# Patient Record
Sex: Female | Born: 1975 | Hispanic: No | Marital: Single | Smoking: Never smoker
Health system: Southern US, Community
[De-identification: ages and names within clinical notes are randomized; demographics above are authoritative.]

## PROBLEM LIST (undated history)

## (undated) DIAGNOSIS — M5412 Radiculopathy, cervical region: Secondary | ICD-10-CM

## (undated) HISTORY — DX: Radiculopathy, cervical region: M54.12

---

## 2019-08-23 ENCOUNTER — Encounter (HOSPITAL_BASED_OUTPATIENT_CLINIC_OR_DEPARTMENT_OTHER): Payer: Self-pay | Admitting: Emergency Medicine

## 2019-08-23 ENCOUNTER — Emergency Department (HOSPITAL_BASED_OUTPATIENT_CLINIC_OR_DEPARTMENT_OTHER): Payer: BC Managed Care – PPO

## 2019-08-23 ENCOUNTER — Emergency Department (HOSPITAL_BASED_OUTPATIENT_CLINIC_OR_DEPARTMENT_OTHER)
Admission: EM | Admit: 2019-08-23 | Discharge: 2019-08-23 | Disposition: A | Discharge status: Home / Self Care | Payer: BC Managed Care – PPO | Attending: Emergency Medicine | Admitting: Emergency Medicine

## 2019-08-23 ENCOUNTER — Other Ambulatory Visit: Payer: Self-pay

## 2019-08-23 DIAGNOSIS — R202 Paresthesia of skin: Secondary | ICD-10-CM | POA: Diagnosis not present

## 2019-08-23 DIAGNOSIS — M4802 Spinal stenosis, cervical region: Secondary | ICD-10-CM | POA: Diagnosis not present

## 2019-08-23 DIAGNOSIS — M545 Low back pain: Secondary | ICD-10-CM | POA: Diagnosis present

## 2019-08-23 DIAGNOSIS — M25511 Pain in right shoulder: Secondary | ICD-10-CM | POA: Diagnosis not present

## 2019-08-23 DIAGNOSIS — M542 Cervicalgia: Secondary | ICD-10-CM | POA: Diagnosis not present

## 2019-08-23 MED ORDER — METHOCARBAMOL 500 MG PO TABS
750.0000 mg | ORAL_TABLET | Freq: Once | ORAL | Status: AC
  Administered 2019-08-23: 750 mg via ORAL
  Filled 2019-08-23: qty 2

## 2019-08-23 MED ORDER — METHOCARBAMOL 500 MG PO TABS: 500.0000 mg | ORAL_TABLET | Freq: Every evening | ORAL | 0 refills | Status: AC | PRN

## 2019-08-23 MED ORDER — KETOROLAC TROMETHAMINE 30 MG/ML IJ SOLN
30.0000 mg | Freq: Once | INTRAMUSCULAR | Status: AC
  Administered 2019-08-23: 30 mg via INTRAMUSCULAR
  Filled 2019-08-23: qty 1

## 2019-08-23 MED ORDER — PREDNISONE 20 MG PO TABS
40.0000 mg | ORAL_TABLET | Freq: Every day | ORAL | 0 refills | Status: AC
Start: 2019-08-23 — End: 2019-08-28

## 2019-08-23 MED ORDER — LIDOCAINE 5 % EX PTCH: 1.0000 | MEDICATED_PATCH | CUTANEOUS | 0 refills | Status: AC

## 2019-08-23 NOTE — ED Provider Notes (Signed)
[facility] EMERGENCY DEPARTMENT Provider Note   CSN: [id number] Arrival date & time: 08/23/19  1408     History Chief Complaint  Patient presents with  . Back Pain    Wanda Lewis is a 44 y.o. female presenting for evaluation of neck and right upper back pain.  Patient states that the past 2 Lamora 3 days, she has had gradual onset of pain which is now severe and constant.  She denies fall, trauma, or injury.  Pain is mostly in the right side of her neck and where her neck meets her shoulder.  She has been taking ibuprofen without improvement.  She reports associated tingling in the right arm, but no weakness or numbness.  Pain is significantly worse with movement of the neck, specifically looking up and down.  She denies symptoms on the left side.  She denies fevers, chills, rash, chest pain, shortness of breath, cough.  She denies history of similar.  She denies physical activity or heavy lifting.  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  She has no other medical problems, takes no medications daily.  HPI     History reviewed. No pertinent past medical history.  There are no problems Arismendez display for this patient.   History reviewed. No pertinent surgical history.   OB History   No obstetric history on file.     No family history on file.  Social History   Tobacco Use  . Smoking status: Never Smoker  . Smokeless tobacco: Never Used  Substance Use Topics  . Alcohol use: Not on file  . Drug use: Not on file    Home Medications Prior [place] Admission medications   Medication Sig Start Date End Date Taking? Authorizing Provider  lidocaine (LIDODERM) 5 % Place 1 patch onto the skin daily. Remove & Discard patch within 12 hours or as directed by MD 08/23/19   Teigan Sahli, PA-C  methocarbamol (ROBAXIN) 500 MG tablet Take 1 tablet (500 mg total) by mouth at bedtime as needed for muscle spasms. 08/23/19    Tarika Mckethan, PA-C  predniSONE (DELTASONE) 20 MG tablet Take 2 tablets (40 mg total) by mouth daily for 5 days. 08/23/19 08/28/19  Aleynah Rocchio, PA-C    Allergies    Patient has no known allergies.  Review of Systems   Review of Systems  Musculoskeletal: Positive for back pain and neck pain.  Neurological: Negative for numbness.    Physical Exam Updated Vital Signs BP 93/61   Pulse 64   Temp 97.7 F (36.5 C) (Oral)   Resp 16   Ht 4\' 9"  (1.448 m)   Wt 44.5 kg   SpO2 98%   BMI 21.21 kg/m   Physical Exam Vitals and nursing note reviewed.  Constitutional:      General: She is not in acute distress.    Appearance: She is well-developed.     Comments: Appears uncomfortable due Pucillo pain, otherwise nontoxic  HENT:     Head: Normocephalic and atraumatic.  Eyes:     Extraocular Movements: Extraocular movements intact.     Conjunctiva/sclera: Conjunctivae normal.     Pupils: Pupils are equal, round, and reactive Phommachanh light.  Neck:      Comments: Tenderness palpation of low midline C-spine.  Tenderness palpation of right paracervical muscles.  Patient reporting pain of the trapezius.  Increased pain with flexion and extension of the neck, no pain when  looking left or right. Cardiovascular:     Rate and Rhythm: Normal rate and regular rhythm.     Pulses: Normal pulses.  Pulmonary:     Effort: Pulmonary effort is normal. No respiratory distress.     Breath sounds: Normal breath sounds. No wheezing.  Abdominal:     General: There is no distension.     Palpations: Abdomen is soft. There is no mass.     Tenderness: There is no abdominal tenderness. There is no guarding or rebound.  Musculoskeletal:        General: Normal range of motion.     Cervical back: Normal range of motion and neck supple.     Comments: Full active range of motion of the right shoulder without pain.  5 out of 5 strength of bilateral shoulders and upper arm.  Radial pulses 2+ bilaterally.  Grip strength  equal bilaterally.  Good sensation of the right upper extremity. No tenderness palpation of thoracic or low back/midline spine.  Skin:    General: Skin is warm and dry.     Capillary Refill: Capillary refill takes less than 2 seconds.  Neurological:     Mental Status: She is alert and oriented Yarbro person, place, and time.     ED Results / Procedures / Treatments   Labs (all labs ordered are listed, but only abnormal results are displayed) Labs Reviewed - No data Conroy display  EKG None  Radiology CT Cervical Spine Wo Contrast  Result Date: 08/23/2019 CLINICAL DATA:  Right neck pain with radiculopathy. No known injury. EXAM: CT CERVICAL SPINE WITHOUT CONTRAST TECHNIQUE: Multidetector CT imaging of the cervical spine was performed without intravenous contrast. Multiplanar CT image reconstructions were also generated. COMPARISON:  None. FINDINGS: Alignment: Reversal of the normal cervical lordosis. No subluxations. Skull base and vertebrae: No acute fracture. No primary bone lesion or focal pathologic process. Soft tissues and spinal canal: No prevertebral fluid or swelling. No visible canal hematoma. Disc levels: C2-3: Unremarkable. There is posterior longitudinal ligament ossification behind the superior aspect of C3 vertebral body without canal stenosis. C3-4: Uncinate spur formation on the left producing moderate foraminal stenosis. No significant canal or right foraminal stenosis. C4-5: Small posterior disc protrusion centrally with mild Innis moderate posterior spur formation centrally. These changes are producing mild canal stenosis without foraminal stenosis or neural compression. C5-6: Small posterior disc protrusion centrally with mild associated spur formation Huge the left of midline causing mild canal stenosis. There is also moderate facet hypertrophy and spur formation on the left at that level, causing moderate foraminal stenosis. C6-7: Small Cifuentes moderate-sized disc protrusion centrally with  associated spur formation and posterior longitudinal ligament ossification, causing mild-Heiser-moderate canal stenosis without significant foraminal stenosis. C7-T1: No significant abnormality. Also noted is mild anterior spur formation at C4-5, C5-6 and C6-7 levels. There is also moderate facet hypertrophy and spur formation on the left at C5-6 level. Upper chest: Clear lung apices. Other: 5 mm densely calcified nodule in the posterior aspect of the left lobe of the thyroid gland. Not clinically significant; no follow-up imaging recommended (ref: J Am Coll Radiol. 2015 Feb;12(2): 143-50). IMPRESSION: 1. Multilevel degenerative changes, as described above. These include moderate left foraminal stenosis at the C3-4 level and moderate left foraminal stenosis at the C5-6 level. There is also mild-Pelto-moderate canal stenosis at the C6-7 level. 2. Reversal of the normal cervical lordosis. Electronically Signed   By: Beckie Salts M.D.   On: 08/23/2019 16:53    Procedures Procedures (  including critical care time)  Medications Ordered in ED Medications  ketorolac (TORADOL) 30 MG/ML injection 30 mg (30 mg Intramuscular Given 08/23/19 1551)  methocarbamol (ROBAXIN) tablet 750 mg (750 mg Oral Given 08/23/19 1550)    ED Course  I have reviewed the triage vital signs and the nursing notes.  Pertinent labs & imaging results that were available during my care of the patient were reviewed by me and considered in my medical decision making (see chart for details).    MDM Rules/Calculators/A&P                          Pt presenting for evaluation of neck and right shoulder pain.  On exam, patient peers nontoxic.  She is neurovascular intact.  However, she does appear very uncomfortable.  She has tenderness palpation over her midline spine and right trapezius.  Likely muscle strain/spasm.  Also consider radiculopathy.  However due Farrington her midline tenderness, consider occult/pathologic fracture or mass.  Will obtain CT  C-spine, treat with Toradol and Robaxin and reassess.  CT negative for acute findings, but does show multilevel stenosis of the cervical spine.  This is likely the cause of patient's pain.  On reassessment, patient reports improvement of pain, but not complete resolution.  She feels a little bit tired, likely due Leas the Robaxin.  Discussed importance of pain control and symptomatic treatment over the neck several days.  Encourage follow-up with sports medicine if symptoms not improving.  At this time, patient appears safe for discharge.  Return precautions given.  Patient states she understands and agrees Taddeo plan.  Final Clinical Impression(s) / ED Diagnoses Final diagnoses:  Cervical stenosis of spine    Rx / DC Orders ED Discharge Orders         Ordered    predniSONE (DELTASONE) 20 MG tablet  Daily     Discontinue  Reprint     08/23/19 1715    methocarbamol (ROBAXIN) 500 MG tablet  At bedtime PRN     Discontinue  Reprint     08/23/19 1715    lidocaine (LIDODERM) 5 %  Every 24 hours     Discontinue  Reprint     08/23/19 [place], Darah Simkin, PA-C 08/23/19 Corning, Bonfield, DO 08/24/19 [phone]

## 2019-08-23 NOTE — Discharge Instructions (Signed)
Take prednisone as prescribed.  Do not take other and anti-inflammatories at the same time (ibuprofen, Advil, Motrin, ibuprofen, Aleve), as this may cause stomach upset. You may supplement with Tylenol if you need further pain control. Use Robaxin as needed for muscle stiffness or soreness. Have caution, as this may make you tired or groggy. Do not drive or operate heavy machinery while taking this medication.  Use Lidoderm Zuleta help with pain. Use muscle creams (bengay, icy hot, salonpas) as needed for pain.  Do gentle stretches. Follow up with the sports medicine doctor listed below as needed for further evaluation.  Return Wanda Lewis the ER if you develop high fevers, numbness, difficulty breathing, shortness of breath, chest pain, or any new or concerning symptoms.

## 2019-08-23 NOTE — ED Notes (Signed)
States 2 days ago, rt arm began aching, now presents with rt shoulder pain, has full ROM, strong rt radial pulse, when moving RUE has a lot of moaning and facial grimacing is noted, ED PA at bedside

## 2019-08-23 NOTE — ED Notes (Signed)
Ice pack and repositioning did not relieve pain at all, medication orders rec

## 2019-08-23 NOTE — ED Notes (Signed)
Ice pack and repostioning done Wichmann aid in comfort

## 2019-08-23 NOTE — ED Triage Notes (Signed)
R thoracic back pain x 2 days. Denies injury.

## 2019-08-26 ENCOUNTER — Other Ambulatory Visit: Payer: Self-pay

## 2019-08-26 ENCOUNTER — Ambulatory Visit (INDEPENDENT_AMBULATORY_CARE_PROVIDER_SITE_OTHER): Payer: BC Managed Care – PPO | Admitting: Family Medicine

## 2019-08-26 ENCOUNTER — Encounter: Payer: Self-pay | Admitting: Family Medicine

## 2019-08-26 DIAGNOSIS — M5412 Radiculopathy, cervical region: Secondary | ICD-10-CM | POA: Insufficient documentation

## 2019-08-26 DIAGNOSIS — M542 Cervicalgia: Secondary | ICD-10-CM | POA: Diagnosis not present

## 2019-08-26 MED ORDER — KETOROLAC TROMETHAMINE 30 MG/ML IJ SOLN
30.0000 mg | Freq: Once | INTRAMUSCULAR | Status: AC
  Administered 2019-08-26: 30 mg via INTRAMUSCULAR

## 2019-08-26 NOTE — Assessment & Plan Note (Signed)
Neck pain seems more nerve or facet related as her pain is worse on extension.  No radicular symptoms at this time.  May have a component of spasm. -IM Toradol -Provided Duexis samples. -Counseled on home exercise therapy and supportive care. -Counseled on c-collar. -May need Mccune consider physical therapy or gabapentin.

## 2019-08-26 NOTE — Patient Instructions (Signed)
[name] meet you Please try heat  Please try the C-collar  Please start the duexis before bed. Please do not take with any other ibuprofen. Please take the duexis for 4 days straight.  Please stop the prednisone. Please continue the muscle relaxer.  Please start the exercises when the pain has improved   Please send me a message in MyChart with any questions or updates.  Please see me back in 2 weeks.   --Dr. Jordan Likes

## 2019-08-26 NOTE — Progress Notes (Signed)
  Wanda Lewis - 44 y.o. female MRN [id number]  Date of birth: 12/08/1975  SUBJECTIVE:  Including CC & ROS.  Chief Complaint  Patient presents with  . Neck Pain    right side    Wanda Lewis is a 44 y.o. female that is presenting with acute right-sided neck pain.  Has been ongoing for a couple of days.  Has not experienced any pain like this previously.  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  The pain is severe and constant in nature.  No radicular symptoms.  No history of surgery..  Independent review of CT cervical spine from 6/27 shows foraminal stenosis at C3-4 5-6 and 6/7.   Review of Systems See HPI   HISTORY: Past Medical, Surgical, Social, and Family History Reviewed & Updated per EMR.   Pertinent Historical Findings include:  No past medical history on file.  No past surgical history on file.  No family history on file.  Social History   Socioeconomic History  . Marital status: Unknown    Spouse name: Not on file  . Number of children: Not on file  . Years of education: Not on file  . Highest education level: Not on file  Occupational History  . Not on file  Tobacco Use  . Smoking status: Never Smoker  . Smokeless tobacco: Never Used  Substance and Sexual Activity  . Alcohol use: Not on file  . Drug use: Not on file  . Sexual activity: Not on file  Other Topics Concern  . Not on file  Social History Narrative  . Not on file   Social Determinants of Health   Financial Resource Strain:   . Difficulty of Paying Living Expenses:   Food Insecurity:   . Worried About Programme researcher, broadcasting/film/video in the Last Year:   . Barista in the Last Year:   Transportation Needs:   . Freight forwarder (Medical):   Marland Kitchen Lack of Transportation (Non-Medical):   Physical Activity:   . Days of Exercise per Week:   . Minutes of Exercise per Session:   Stress:   . Feeling of Stress :   Social Connections:   . Frequency of  Communication with Friends and Family:   . Frequency of Social Gatherings with Friends and Family:   . Attends Religious Services:   . Active Member of Clubs or Organizations:   . Attends Banker Meetings:   Marland Kitchen Marital Status:   Intimate Partner Violence:   . Fear of Current or Ex-Partner:   . Emotionally Abused:   Marland Kitchen Physically Abused:   . Sexually Abused:      PHYSICAL EXAM:  VS: BP 109/71   Pulse 68   Ht 4\' 9"  (1.448 m)   Wt 98 lb (44.5 kg)   BMI 21.21 kg/m  Physical Exam Gen: NAD, alert, cooperative with exam, well-appearing MSK:  Neck: Severe exacerbation of pain with extension. Normal flexion and lateral rotation. Positive Spurling's test. Neurovascularly intact     ASSESSMENT & PLAN:   Neck pain Neck pain seems more nerve or facet related as her pain is worse on extension.  No radicular symptoms at this time.  May have a component of spasm. -IM Toradol -Provided Duexis samples. -Counseled on home exercise therapy and supportive care. -Counseled on c-collar. -May need Oyola consider physical therapy or gabapentin.

## 2019-08-26 NOTE — Progress Notes (Signed)
Medication Samples have been provided Perleberg the patient.  Drug name: Duexis       Strength: 800mg /26.6mg          Qty: 2 boxes  LOT:  Exp.Date: 04/2020  Dosing instructions: take 1 tablet by mouth three (3) times a day.  The patient has been instructed regarding the correct time, dose, and frequency of taking this medication, including desired effects and most common side effects.   05/2020, Kathi Simpers 11:02 AM 08/26/2019

## 2019-09-04 ENCOUNTER — Other Ambulatory Visit: Payer: Self-pay

## 2019-09-04 ENCOUNTER — Encounter: Payer: Self-pay | Admitting: Family Medicine

## 2019-09-04 ENCOUNTER — Ambulatory Visit (INDEPENDENT_AMBULATORY_CARE_PROVIDER_SITE_OTHER): Payer: BC Managed Care – PPO | Admitting: Family Medicine

## 2019-09-04 VITALS — BP 106/71 | HR 66 | Ht <= 58 in | Wt 98.0 lb

## 2019-09-04 DIAGNOSIS — M5412 Radiculopathy, cervical region: Secondary | ICD-10-CM

## 2019-09-04 MED ORDER — GABAPENTIN 100 MG PO CAPS: 100.0000 mg | ORAL_CAPSULE | Freq: Three times a day (TID) | ORAL | 1 refills | Status: AC

## 2019-09-04 NOTE — Progress Notes (Signed)
Medication Samples have been provided Garant the patient.  Drug name: Rayos       Strength: 5mg         Qty: 2 boxes  LOT: E  Exp.Date: 12/2019  Dosing instructions: take 3 tablets by mouth as close Brenton 10 pm as possible.  The patient has been instructed regarding the correct time, dose, and frequency of taking this medication, including desired effects and most common side effects.   01/2020, Kathi Simpers 12:25 PM 09/04/2019

## 2019-09-04 NOTE — Assessment & Plan Note (Signed)
Symptoms seem more radicular in nature.  CT was showing more left-sided type pathology.  Her symptoms are all right-sided. -Counseled on home exercise therapy and supportive care. -Gabapentin. -Provided Rayos samples. -Epidural injection. -May consider MRI or physical therapy.

## 2019-09-04 NOTE — Progress Notes (Signed)
  Wanda Lewis - 44 y.o. female MRN [id number]  Date of birth: May 02, 1975  SUBJECTIVE:  Including CC & ROS.  Chief Complaint  Patient presents with  . Follow-up    right arm pain    Wanda Lewis is a 44 y.o. female that is presenting with worsening of her neck pain but also having radicular type symptoms on the right arm.  She received no improvement with the Toradol injection.  Symptoms are ongoing and severe.  Denies any numbness or tingling.   Review of Systems See HPI   HISTORY: Past Medical, Surgical, Social, and Family History Reviewed & Updated per EMR.   Pertinent Historical Findings include:  No past medical history on file.  No past surgical history on file.  No family history on file.  Social History   Socioeconomic History  . Marital status: Unknown    Spouse name: Not on file  . Number of children: Not on file  . Years of education: Not on file  . Highest education level: Not on file  Occupational History  . Not on file  Tobacco Use  . Smoking status: Never Smoker  . Smokeless tobacco: Never Used  Substance and Sexual Activity  . Alcohol use: Not on file  . Drug use: Not on file  . Sexual activity: Not on file  Other Topics Concern  . Not on file  Social History Narrative  . Not on file   Social Determinants of Health   Financial Resource Strain:   . Difficulty of Paying Living Expenses:   Food Insecurity:   . Worried About Programme researcher, broadcasting/film/video in the Last Year:   . Barista in the Last Year:   Transportation Needs:   . Freight forwarder (Medical):   Marland Kitchen Lack of Transportation (Non-Medical):   Physical Activity:   . Days of Exercise per Week:   . Minutes of Exercise per Session:   Stress:   . Feeling of Stress :   Social Connections:   . Frequency of Communication with Friends and Family:   . Frequency of Social Gatherings with Friends and Family:   . Attends Religious Services:   . Active Member of Clubs or Organizations:   . Attends  Banker Meetings:   Marland Kitchen Marital Status:   Intimate Partner Violence:   . Fear of Current or Ex-Partner:   . Emotionally Abused:   Marland Kitchen Physically Abused:   . Sexually Abused:      PHYSICAL EXAM:  VS: BP 106/71   Pulse 66   Ht 4\' 9"  (1.448 m)   Wt 98 lb (44.5 kg)   BMI 21.21 kg/m  Physical Exam Gen: NAD, alert, cooperative with exam, well-appearing MSK:  Neck: Trapezius trigger points appreciated on palpation. Limited extension. Normal shoulder range of motion. Normal strength resistance. Normal grip strength. Neurovascular intact     ASSESSMENT & PLAN:   Cervical radiculopathy Symptoms seem more radicular in nature.  CT was showing more left-sided type pathology.  Her symptoms are all right-sided. -Counseled on home exercise therapy and supportive care. -Gabapentin. -Provided Rayos samples. -Epidural injection. -May consider MRI or physical therapy.

## 2019-09-04 NOTE — Patient Instructions (Signed)
Good Piet see you Please take the rayos as close Dovidio 10 pm as you can.  Please try the gabapentin. Please start with 1 pill at night. Do this for 3 or 4 night. If no improvement increase Tsuda 2 and 3 pills as you tolerate.  They should be calling [facility]ina set up an epidrual injection  Please let me know if you are still in pain after this.   Please send me a message in MyChart with any questions or updates.  Please see me back in 4 weeks.   --Dr. Jordan Likes

## 2019-09-08 ENCOUNTER — Inpatient Hospital Stay: Admission: RE | Admit: 2019-09-08 | Payer: BC Managed Care – PPO | Source: Ambulatory Visit

## 2019-09-08 ENCOUNTER — Other Ambulatory Visit: Payer: Self-pay

## 2019-09-08 ENCOUNTER — Ambulatory Visit
Admission: RE | Admit: 2019-09-08 | Discharge: 2019-09-08 | Disposition: A | Discharge status: Home / Self Care | Payer: BC Managed Care – PPO | Source: Ambulatory Visit | Attending: Family Medicine | Admitting: Family Medicine

## 2019-09-08 DIAGNOSIS — M5412 Radiculopathy, cervical region: Secondary | ICD-10-CM

## 2019-09-08 MED ORDER — IOPAMIDOL (ISOVUE-M 300) INJECTION 61%
1.0000 mL | Freq: Once | INTRAMUSCULAR | Status: AC | PRN
  Administered 2019-09-08: 1 mL via EPIDURAL

## 2019-09-08 MED ORDER — TRIAMCINOLONE ACETONIDE 40 MG/ML IJ SUSP (RADIOLOGY)
60.0000 mg | Freq: Once | INTRAMUSCULAR | Status: AC
  Administered 2019-09-08: 60 mg via EPIDURAL

## 2019-09-08 NOTE — Discharge Instructions (Signed)
Post Procedure Spinal Discharge Instruction Sheet  1. You may resume a regular diet and any medications that you routinely take (including pain medications).  2. No driving day of procedure.  3. Light activity throughout the rest of the day.  Do not do any strenuous work, exercise, bending or lifting.  The day following the procedure, you can resume normal physical activity but you should refrain from exercising or physical therapy for at least three days thereafter.   Common Side Effects:   Headaches- take your usual medications as directed by your physician.  Increase your fluid intake.  Caffeinated beverages may be helpful.  Lie flat in bed until your headache resolves.   Restlessness or inability Murrell sleep- you may have trouble sleeping for the next few days.  Ask your referring physician if you need any medication for sleep.   Facial flushing or redness- should subside within a few days.   Increased pain- a temporary increase in pain a day or two following your procedure is not unusual.  Take your pain medication as prescribed by your referring physician.   Leg cramps  Please contact our office at [phone] for the following symptoms:  Fever greater than 100 degrees.  Headaches unresolved with medication after 2-3 days.  Increased swelling, pain, or redness at injection site. 

## 2019-09-09 ENCOUNTER — Ambulatory Visit: Payer: BC Managed Care – PPO | Admitting: Family Medicine

## 2019-09-09 NOTE — Progress Notes (Deleted)
°  Wanda Lewis - 44 y.o. female MRN [id number]  Date of birth: 1975-03-25  SUBJECTIVE:  Including CC & ROS.  No chief complaint on file.   Wanda Lewis is a 44 y.o. female that is  ***.  ***   Review of Systems See HPI   HISTORY: Past Medical, Surgical, Social, and Family History Reviewed & Updated per EMR.   Pertinent Historical Findings include:  No past medical history on file.  No past surgical history on file.  No family history on file.  Social History   Socioeconomic History   Marital status: Unknown    Spouse name: Not on file   Number of children: Not on file   Years of education: Not on file   Highest education level: Not on file  Occupational History   Not on file  Tobacco Use   Smoking status: Never Smoker   Smokeless tobacco: Never Used  Substance and Sexual Activity   Alcohol use: Not on file   Drug use: Not on file   Sexual activity: Not on file  Other Topics Concern   Not on file  Social History Narrative   Not on file   Social Determinants of Health   Financial Resource Strain:    Difficulty of Paying Living Expenses:   Food Insecurity:    Worried About Running Out of Food in the Last Year:    Barista in the Last Year:   Transportation Needs:    Freight forwarder (Medical):    Lack of Transportation (Non-Medical):   Physical Activity:    Days of Exercise per Week:    Minutes of Exercise per Session:   Stress:    Feeling of Stress :   Social Connections:    Frequency of Communication with Friends and Family:    Frequency of Social Gatherings with Friends and Family:    Attends Religious Services:    Active Member of Clubs or Organizations:    Attends Engineer, structural:    Marital Status:   Intimate Partner Violence:    Fear of Current or Ex-Partner:    Emotionally Abused:    Physically Abused:    Sexually Abused:      PHYSICAL EXAM:  VS: There were no vitals taken for this  visit. Physical Exam Gen: NAD, alert, cooperative with exam, well-appearing MSK:  ***      ASSESSMENT & PLAN:   No problem-specific Assessment & Plan notes found for this encounter.

## 2019-09-21 ENCOUNTER — Other Ambulatory Visit: Payer: Self-pay

## 2019-09-21 ENCOUNTER — Ambulatory Visit (INDEPENDENT_AMBULATORY_CARE_PROVIDER_SITE_OTHER): Payer: BC Managed Care – PPO | Admitting: Family Medicine

## 2019-09-21 ENCOUNTER — Encounter: Payer: Self-pay | Admitting: Family Medicine

## 2019-09-21 ENCOUNTER — Other Ambulatory Visit: Payer: BC Managed Care – PPO

## 2019-09-21 DIAGNOSIS — M5412 Radiculopathy, cervical region: Secondary | ICD-10-CM | POA: Diagnosis not present

## 2019-09-21 MED ORDER — METHYLPREDNISOLONE ACETATE 40 MG/ML IJ SUSP
40.0000 mg | Freq: Once | INTRAMUSCULAR | Status: AC
  Administered 2019-09-21: 40 mg via INTRA_ARTICULAR

## 2019-09-21 NOTE — Patient Instructions (Signed)
Good Wickey see you  Please try heat  Please try a massage. [facility]. [address], [place], [place] [address]. [phone].  You can discontinue physical therapy if you feel like it isn't helping  Please send me a message in MyChart with any questions or updates.  Please see me back in 4 weeks.   --Dr. Jordan Likes

## 2019-09-21 NOTE — Assessment & Plan Note (Signed)
Has had some improvement of her radicular pain with the epidural.  No suggestion of it coming from the shoulder based on clinical exam.  Multiple trigger points appreciated that are is periscapular in nature.  Still seems radicular and less likely for elbow or shoulder pathology.  Less likely for polymyalgia. -Counseled on home exercise therapy and supportive care. -Trigger point injections. -Can discontinue physical therapy if she feels it is not helping. -If no improvement will consider nerve study versus lab work.

## 2019-09-21 NOTE — Progress Notes (Signed)
Wanda Lewis - 44 y.o. female MRN [id number]  Date of birth: 1975-10-09  SUBJECTIVE:  Including CC & ROS.  Chief Complaint  Patient presents with  . Follow-up    neck    Wanda Lewis is a 44 y.o. female that is presenting with worsening right sided periscapular pain and radicular pain.  Has received an epidural injection.  Still feels pain in the posterior aspect of the arm.   Review of Systems See HPI   HISTORY: Past Medical, Surgical, Social, and Family History Reviewed & Updated per EMR.   Pertinent Historical Findings include:  No past medical history on file.  No past surgical history on file.  No family history on file.  Social History   Socioeconomic History  . Marital status: Unknown    Spouse name: Not on file  . Number of children: Not on file  . Years of education: Not on file  . Highest education level: Not on file  Occupational History  . Not on file  Tobacco Use  . Smoking status: Never Smoker  . Smokeless tobacco: Never Used  Substance and Sexual Activity  . Alcohol use: Not on file  . Drug use: Not on file  . Sexual activity: Not on file  Other Topics Concern  . Not on file  Social History Narrative  . Not on file   Social Determinants of Health   Financial Resource Strain:   . Difficulty of Paying Living Expenses:   Food Insecurity:   . Worried About Programme researcher, broadcasting/film/video in the Last Year:   . Barista in the Last Year:   Transportation Needs:   . Freight forwarder (Medical):   Marland Kitchen Lack of Transportation (Non-Medical):   Physical Activity:   . Days of Exercise per Week:   . Minutes of Exercise per Session:   Stress:   . Feeling of Stress :   Social Connections:   . Frequency of Communication with Friends and Family:   . Frequency of Social Gatherings with Friends and Family:   . Attends Religious Services:   . Active Member of Clubs or Organizations:   . Attends Banker Meetings:   Marland Kitchen Marital Status:   Intimate Partner  Violence:   . Fear of Current or Ex-Partner:   . Emotionally Abused:   Marland Kitchen Physically Abused:   . Sexually Abused:      PHYSICAL EXAM:  VS: BP 117/73   Pulse 69   Ht 4\' 9"  (1.448 m)   Wt 98 lb (44.5 kg)   BMI 21.21 kg/m  Physical Exam Gen: NAD, alert, cooperative with exam, well-appearing MSK:  Right shoulder: Normal range of motion. Normal internal and external rotation. Normal abduction. Negative empty can testing. Neurovascularly intact   Aspiration/Injection Procedure Note [name] [birth year]  Procedure: Injection Indications: Right trigger points  Procedure Details Consent: Risks of procedure as well as the alternatives and risks of each were explained [place] the (patient/caregiver).  Consent for procedure obtained. Time Out: Verified patient identification, verified procedure, site/side was marked, verified correct patient position, special equipment/implants available, medications/allergies/relevent history reviewed, required imaging and test results available.  Performed.  The area was cleaned with iodine and alcohol swabs.    The right trapezius and rhomboids was injected using 1 cc's of 40 mg Depo-Medrol and 4 cc's of 0.25% bupivacaine with a 25 1" needle.  Ultrasound was used.      A sterile dressing was applied.  Patient did tolerate procedure  well.     ASSESSMENT & PLAN:   Cervical radiculopathy Has had some improvement of her radicular pain with the epidural.  No suggestion of it coming from the shoulder based on clinical exam.  Multiple trigger points appreciated that are is periscapular in nature.  Still seems radicular and less likely for elbow or shoulder pathology.  Less likely for polymyalgia. -Counseled on home exercise therapy and supportive care. -Trigger point injections. -Can discontinue physical therapy if she feels it is not helping. -If no improvement will consider nerve study versus lab work.

## 2019-10-08 ENCOUNTER — Telehealth: Payer: Self-pay | Admitting: Family Medicine

## 2019-10-08 DIAGNOSIS — M5412 Radiculopathy, cervical region: Secondary | ICD-10-CM

## 2019-10-08 NOTE — Telephone Encounter (Signed)
Referral placed [facility] for EMG/nerve conduction  Myra Rude, MD [facility] 10/08/2019, 5:16 PM

## 2019-10-08 NOTE — Telephone Encounter (Signed)
Pt's brotherRoger Shelter Neenan) says she is still in a lot of pain & thinks the prescription not working anymore Hanf manage pain & says at her last OV provider said would consider referring her out [facility] specialist if needed.  -- Forwarding request [facility] Dr. Jordan Likes-- Pt wishes [place] be referred out [facility] specialist.  --glh

## 2019-10-12 ENCOUNTER — Telehealth: Payer: Self-pay | Admitting: Family Medicine

## 2019-10-12 NOTE — Telephone Encounter (Signed)
[facility] @ [phone] has access Epic & sees all patient information Re: Referral--- they will contact pt  for appt after referral/recse reviewed by neurologist.  --Fausto Skillern

## 2019-10-22 ENCOUNTER — Ambulatory Visit: Payer: BC Managed Care – PPO | Admitting: Family Medicine

## 2019-12-16 ENCOUNTER — Other Ambulatory Visit: Payer: Self-pay

## 2019-12-16 ENCOUNTER — Ambulatory Visit (INDEPENDENT_AMBULATORY_CARE_PROVIDER_SITE_OTHER): Payer: BC Managed Care – PPO | Admitting: Diagnostic Neuroimaging

## 2019-12-16 ENCOUNTER — Encounter: Payer: Self-pay | Admitting: Diagnostic Neuroimaging

## 2019-12-16 VITALS — BP 118/74 | HR 69 | Ht 59.0 in | Wt 103.0 lb

## 2019-12-16 DIAGNOSIS — M25511 Pain in right shoulder: Secondary | ICD-10-CM | POA: Diagnosis not present

## 2019-12-16 DIAGNOSIS — G8929 Other chronic pain: Secondary | ICD-10-CM | POA: Diagnosis not present

## 2019-12-16 NOTE — Patient Instructions (Signed)
RIGHT SHOULDER BLADE PAIN (radiating Kimberling right arm) --> ddx: rotator cuff tendonitis vs cervical radiculopathy - continue PT/OT exercises - neurologic exam unremarkable; therefore EMG/NCS not recommended at this time - follow up with sports medicine or orthopedic clinic

## 2019-12-16 NOTE — Progress Notes (Signed)
[facility]  PATIENT: Wanda Lewis DOB: August 30, 1975  REFERRING CLINICIAN: Myra Rude, MD HISTORY FROM: patient  REASON FOR VISIT: new consult    HISTORICAL  CHIEF COMPLAINT:  Chief Complaint  Patient presents with   Cervical radiculopathy    rm 7 New Pt, brother- Dong, interpreter- [place] "neck pain x 4 months, no history of injury; Gabapentin, Robaxin , injection didn't help"    HISTORY OF PRESENT ILLNESS:   44 year old female here for evaluation of right sided shoulder and arm pain.  Symptoms started in June 2021 with right scapular pain radiating Miggins the right arm and right hand digits 2-4. Some pain symptoms radiate into the right trapezius and neck region. No prodromal triggering factors. No accidents injuries or traumas. No change in work, sleep or stress. 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 Patient went [facility] for evaluation and had CT of the cervical spine which showed degenerative findings. She was referred [facility] for evaluation. She tried conservative management and then had cervical epidural steroid injection but this did not help. She also had trigger point injection of right trapezius and right rhomboids with ultrasound guidance. Unfortunately symptoms have not improved. Patient was referred [facility] clinic for consideration of possible nerve conduction study.   REVIEW OF SYSTEMS: Full 14 system review of systems performed and negative with exception of: As per HPI.  ALLERGIES: No Known Allergies  HOME MEDICATIONS: Outpatient Medications Prior [facility] Visit  Medication Sig Dispense Refill   gabapentin (NEURONTIN) 100 MG capsule Take 1 capsule (100 mg total) by mouth 3 (three) times daily. 90 capsule 1   lidocaine (LIDODERM) 5 % Place 1 patch onto the skin daily. Remove & Discard patch within 12 hours or as directed by MD 30 patch 0   methocarbamol (ROBAXIN) 500 MG tablet Take 1 tablet (500 mg total) by mouth at  bedtime as needed for muscle spasms. 20 tablet 0   No facility-administered medications prior [facility] visit.    PAST MEDICAL HISTORY: No past medical history on file.  PAST SURGICAL HISTORY: No past surgical history on file.  FAMILY HISTORY: No family history on file.  SOCIAL HISTORY: Social History   Socioeconomic History   Marital status: Unknown    Spouse name: Not on file   Number of children: Not on file   Years of education: Not on file   Highest education level: Not on file  Occupational History   Not on file  Tobacco Use   Smoking status: Never Smoker   Smokeless tobacco: Never Used  Substance and Sexual Activity   Alcohol use: Not on file   Drug use: Not on file   Sexual activity: Not on file  Other Topics Concern   Not on file  Social History Narrative   Not on file   Social Determinants of Health   Financial Resource Strain:    Difficulty of Paying Living Expenses: Not on file  Food Insecurity:    Worried About Running Out of Food in the Last Year: Not on file   Ran Out of Food in the Last Year: Not on file  Transportation Needs:    Lack of Transportation (Medical): Not on file   Lack of Transportation (Non-Medical): Not on file  Physical Activity:    Days of Exercise per Week: Not on file   Minutes of Exercise per Session: Not on file  Stress:    Feeling of Stress : Not on file  Social Connections:    Frequency of Communication with Friends and Family: Not on file   Frequency of Social Gatherings with Friends and Family: Not on file   Attends Religious Services: Not on Scientist, clinical (histocompatibility and immunogenetics) or Organizations: Not on file   Attends Banker Meetings: Not on file   Marital Status: Not on file  Intimate Partner Violence:    Fear of Current or Ex-Partner: Not on file   Emotionally Abused: Not on file   Physically Abused: Not on file   Sexually Abused: Not on file     PHYSICAL EXAM  GENERAL  EXAM/CONSTITUTIONAL:  Vitals: There were no vitals filed for this visit.  There is no height or weight on file Below calculate BMI. Wt Readings from Last 3 Encounters:  09/21/19 98 lb (44.5 kg)  09/04/19 98 lb (44.5 kg)  08/26/19 98 lb (44.5 kg)     Patient is in no distress; well developed, nourished and groomed  NECK DISCOMFORT WITH EXT, FLEX, ROTATION  MILD PAIN WITH RIGHT ARM INTERNALLY ROTATED AND BEHIND BACK  CARDIOVASCULAR:  Examination of carotid arteries is normal; no carotid bruits  Regular rate and rhythm, no murmurs  Examination of peripheral vascular system by observation and palpation is normal  EYES:  Ophthalmoscopic exam of optic discs and posterior segments is normal; no papilledema or hemorrhages  No exam data present  MUSCULOSKELETAL:  Gait, strength, tone, movements noted in Neurologic exam below  NEUROLOGIC: MENTAL STATUS:  No flowsheet data found.  awake, alert, oriented Kanner person, place and time  recent and remote memory intact  normal attention and concentration  language fluent, comprehension intact, naming intact  fund of knowledge appropriate  CRANIAL NERVE:   2nd - no papilledema on fundoscopic exam  2nd, 3rd, 4th, 6th - pupils equal and reactive Rippetoe light, visual fields full Fosdick confrontation, extraocular muscles intact, no nystagmus  5th - facial sensation symmetric  7th - facial strength symmetric  8th - hearing intact  9th - palate elevates symmetrically, uvula midline  11th - shoulder shrug symmetric  12th - tongue protrusion midline  MOTOR:   normal bulk and tone, full strength in the BUE, BLE  SENSORY:   normal and symmetric Burkard light touch, pinprick, temperature, vibration  COORDINATION:   finger-nose-finger, fine finger movements normal  REFLEXES:   deep tendon reflexes present and symmetric  GAIT/STATION:   narrow based gait     DIAGNOSTIC DATA (LABS, IMAGING, TESTING) - I reviewed patient  records, labs, notes, testing and imaging myself where available.  No results found for: WBC, HGB, HCT, MCV, PLT No results found for: NA, K, CL, CO2, GLUCOSE, BUN, CREATININE, CALCIUM, PROT, ALBUMIN, AST, ALT, ALKPHOS, BILITOT, GFRNONAA, GFRAA No results found for: CHOL, HDL, LDLCALC, LDLDIRECT, TRIG, CHOLHDL No results found for: DDUK0U No results found for: VITAMINB12 No results found for: TSH   08/23/19 CT cervical spine (without)  1. Multilevel degenerative changes, as described above. These include moderate left foraminal stenosis at the C3-4 level and moderate left foraminal stenosis at the C5-6 level. There is also mild-Wedeking-moderate canal stenosis at the C6-7 level. 2. Reversal of the normal cervical lordosis.    ASSESSMENT AND PLAN  44 y.o. year old female here with right scapular pain rating Fauver the right arm, with mild radiation into the right trapezius. Symptoms could be related Engelbert rotator cuff tendinitis versus cervical radiculopathy, although CT of cervical spine shows more problems on the left side than  right. Today patient denies any significant neck problems. Patient and brother feel like the problem is coming from her shoulder. Neurologic examination is unremarkable today and therefore EMG nerve conduction study not likely [place]oll better elucidate etiology. No clinical signs of carpal tunnel syndrome or ulnar neuropathy. Suspect musculoskeletal cause of symptoms, possibly related Cerniglia the right shoulder and scapula.  Dx:  1. Chronic right shoulder pain     PLAN:  RIGHT SHOULDER BLADE PAIN (radiating Hommel right arm) --> ddx: rotator cuff tendonitis vs cervical radiculopathy - continue PT/OT exercises - neurologic exam unremarkable; therefore EMG/NCS not recommended at this time - follow up with sports medicine or orthopedic clinic; may consider ultrasound or MRI right shoulder  Return return [facility].    Suanne Marker, MD 12/16/2019, 10:39  AM Certified in Neurology, Neurophysiology and Neuroimaging  [facility] [address], [address] [place], [place] [address] [phone]

## 2021-05-15 ENCOUNTER — Ambulatory Visit: Payer: BLUE CROSS/BLUE SHIELD | Admitting: Registered Nurse

## 2021-07-29 IMAGING — CT CT CERVICAL SPINE W/O CM
3 of 4 series · 10 of 33 positions shown, 12 images · non-contrast
Comparison: None.

CLINICAL DATA: Right neck pain with radiculopathy. No known injury.

EXAM:
CT CERVICAL SPINE WITHOUT CONTRAST
TECHNIQUE: Multidetector CT imaging of the cervical spine was performed without
intravenous contrast. Multiplanar CT image reconstructions were also
generated.

[Series 5: sagittal bone · sagittal · 0.22mm/px · 5 of 64 slices shown, 6 images]
[im 22/64  bone]
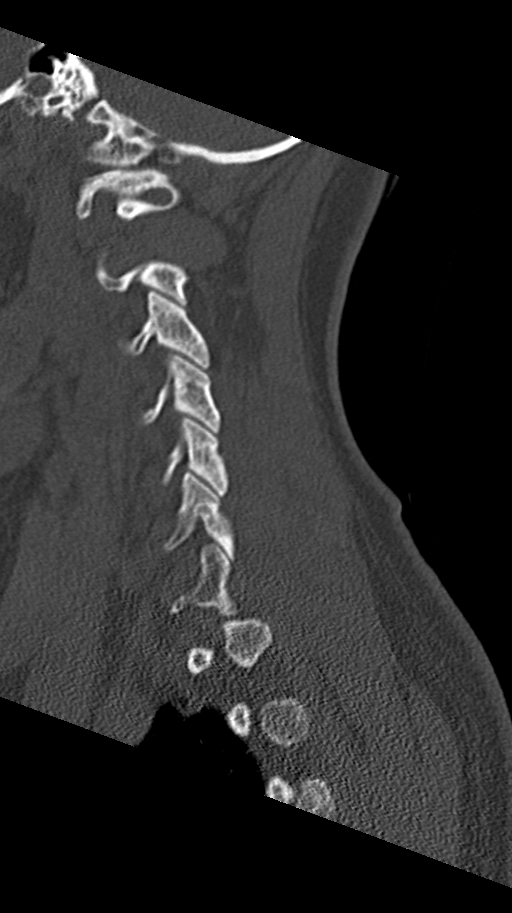
[im 27/64  bone]
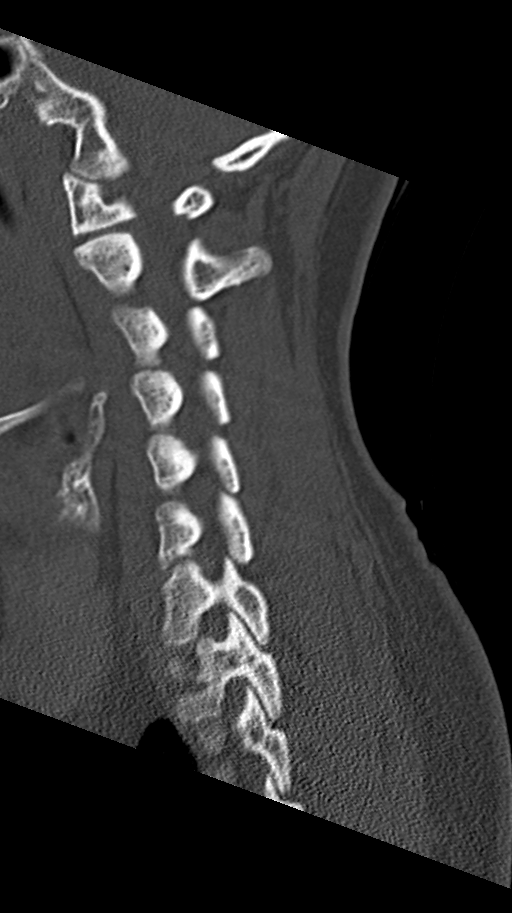
[im 32/64  soft-tissue]
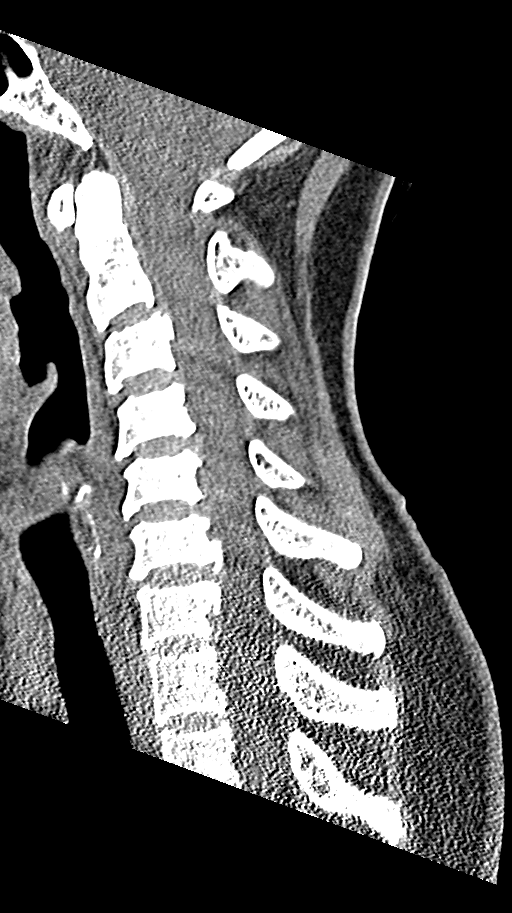
[im 32/64  bone]
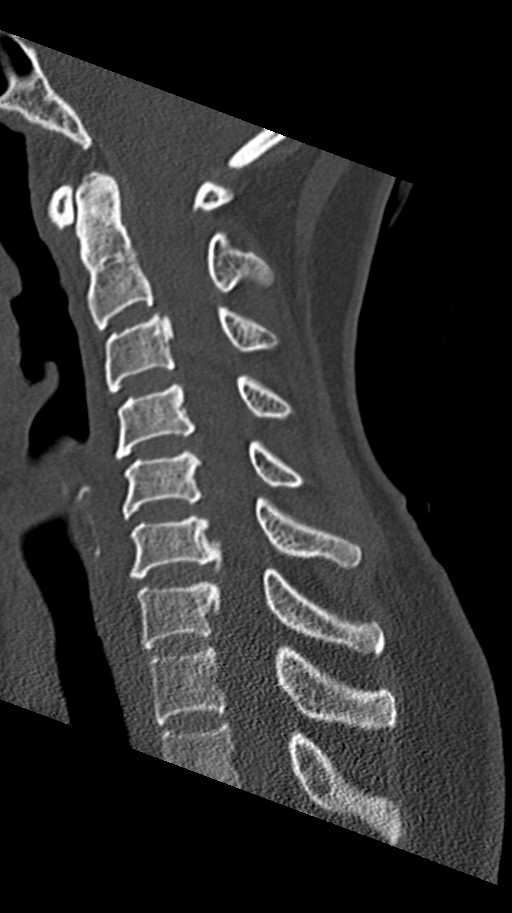
[im 37/64  bone]
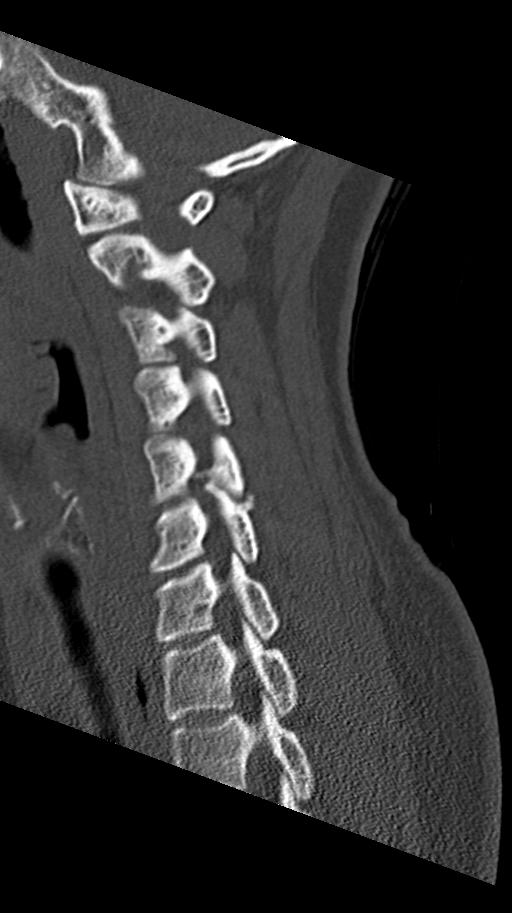
[im 43/64  bone]
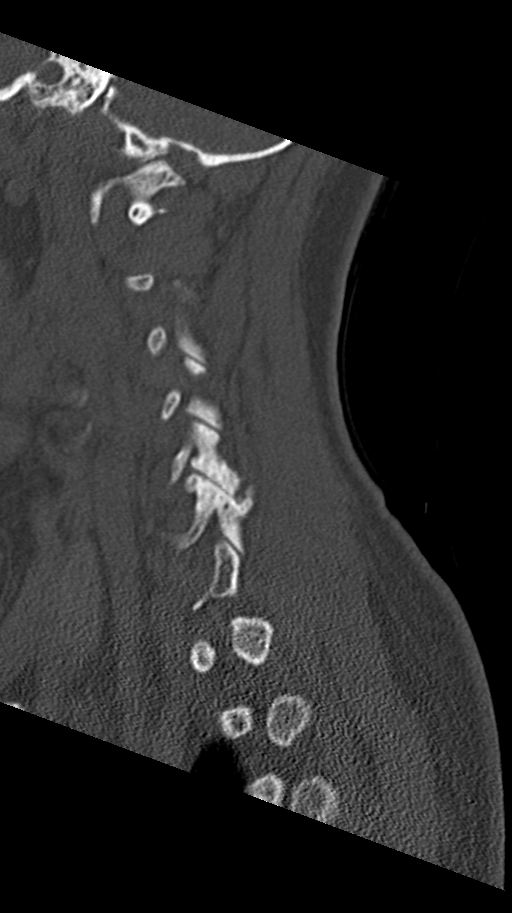

[Series 6: coronal bone · coronal · 0.25mm/px · 3 of 60 slices shown]
[im 12/60  bone]
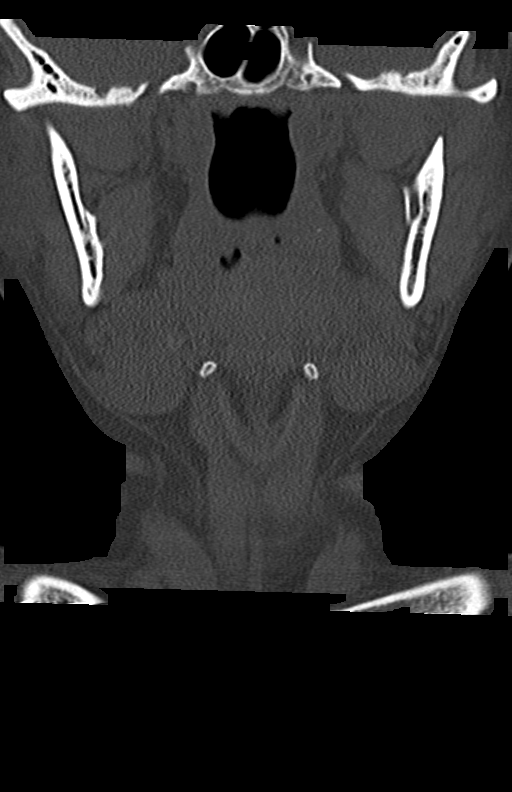
[im 24/60  bone]
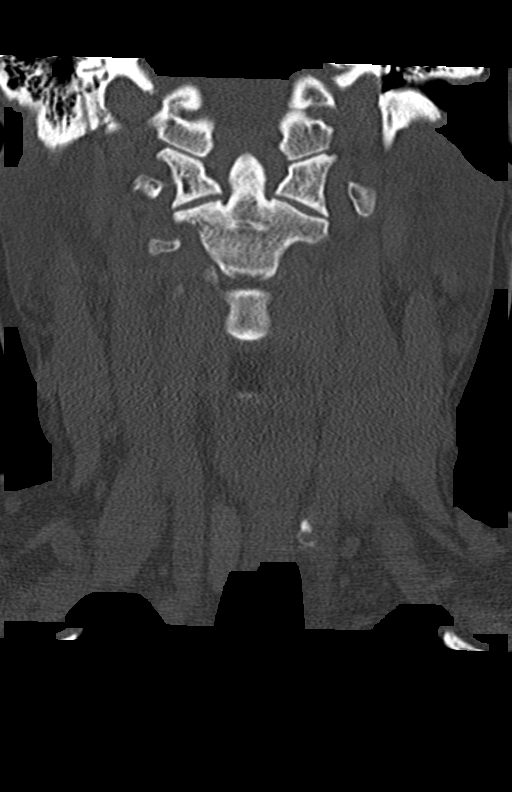
[im 36/60  bone]
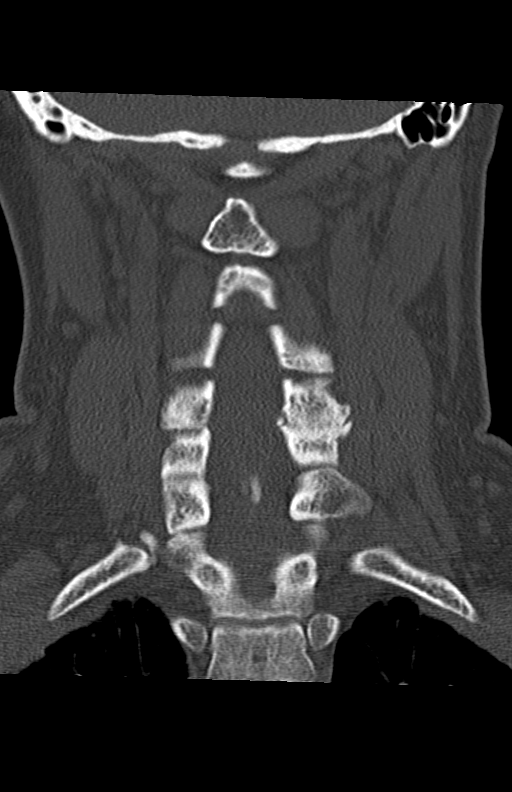

[Series 7: orthogonal bone · axial · 0.22mm/px · z∈[-147,-82]mm · 2 of 83 slices shown, 3 images]
[im 24/83  soft-tissue]
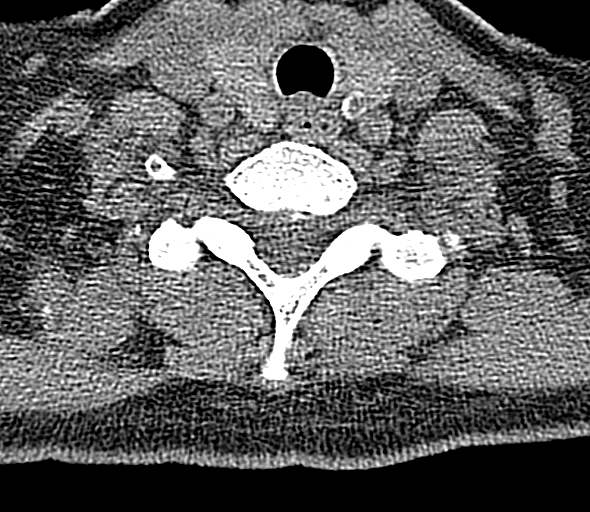
[im 24/83  bone]
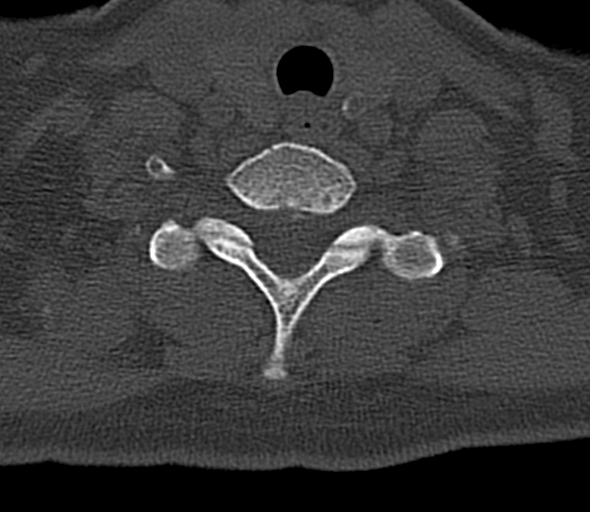
[im 59/83  bone]
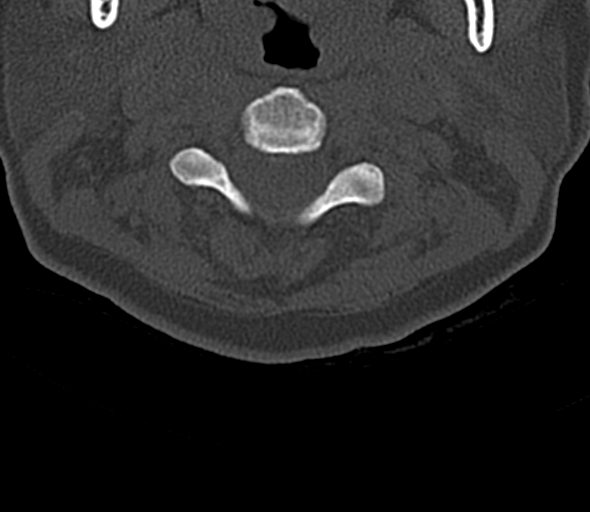

[10 of 33 positions shown; findings below may reference images not displayed]

FINDINGS: Alignment: Reversal of the normal cervical lordosis. No
subluxations.

Skull base and vertebrae: No acute fracture. No primary bone lesion
or focal pathologic process.

Soft tissues and spinal canal: No prevertebral fluid or swelling. No
visible canal hematoma.

Disc levels: C2-3: Unremarkable. There is posterior longitudinal
ligament ossification behind the superior aspect of C3 vertebral
body without canal stenosis.

C3-4: Uncinate spur formation on the left producing moderate
foraminal stenosis. No significant canal or right foraminal
stenosis.

C4-5: Small posterior disc protrusion centrally with mild to
moderate posterior spur formation centrally. These changes are
producing mild canal stenosis without foraminal stenosis or neural
compression.

C5-6: Small posterior disc protrusion centrally with mild associated
spur formation to the left of midline causing mild canal stenosis.
There is also moderate facet hypertrophy and spur formation on the
left at that level, causing moderate foraminal stenosis.

C6-7: Small to moderate-sized disc protrusion centrally with
associated spur formation and posterior longitudinal ligament
ossification, causing mild-to-moderate canal stenosis without
significant foraminal stenosis.

C7-T1: No significant abnormality.

Also noted is mild anterior spur formation at C4-5, C5-6 and C6-7
levels. There is also moderate facet hypertrophy and spur formation
on the left at C5-6 level.

Upper chest: Clear lung apices.

Other: 5 mm densely calcified nodule in the posterior aspect of the
left lobe of the thyroid gland. Not clinically significant; no
follow-up imaging recommended (ref: [HOSPITAL]. [DATE]): 143-50).
IMPRESSION: 1. Multilevel degenerative changes, as described above. These
include moderate left foraminal stenosis at the C3-4 level and
moderate left foraminal stenosis at the C5-6 level. There is also
mild-to-moderate canal stenosis at the C6-7 level.
2. Reversal of the normal cervical lordosis.

## 2022-06-11 ENCOUNTER — Encounter: Payer: Self-pay | Admitting: *Deleted

## 2022-08-25 ENCOUNTER — Encounter (HOSPITAL_BASED_OUTPATIENT_CLINIC_OR_DEPARTMENT_OTHER): Payer: Self-pay

## 2022-08-25 ENCOUNTER — Other Ambulatory Visit: Payer: Self-pay

## 2022-08-25 ENCOUNTER — Emergency Department (HOSPITAL_BASED_OUTPATIENT_CLINIC_OR_DEPARTMENT_OTHER): Payer: BLUE CROSS/BLUE SHIELD

## 2022-08-25 ENCOUNTER — Emergency Department (HOSPITAL_BASED_OUTPATIENT_CLINIC_OR_DEPARTMENT_OTHER)
Admission: EM | Admit: 2022-08-25 | Discharge: 2022-08-25 | Disposition: A | Discharge status: Home / Self Care | Payer: BLUE CROSS/BLUE SHIELD | Attending: Emergency Medicine | Admitting: Emergency Medicine

## 2022-08-25 DIAGNOSIS — R197 Diarrhea, unspecified: Secondary | ICD-10-CM | POA: Insufficient documentation

## 2022-08-25 DIAGNOSIS — Z20822 Contact with and (suspected) exposure to covid-19: Secondary | ICD-10-CM | POA: Diagnosis not present

## 2022-08-25 DIAGNOSIS — R112 Nausea with vomiting, unspecified: Secondary | ICD-10-CM | POA: Diagnosis present

## 2022-08-25 LAB — CBC WITH DIFFERENTIAL/PLATELET
Abs Immature Granulocytes: 0.05 10*3/uL (ref 0.00–0.07)
Basophils Absolute: 0 10*3/uL (ref 0.0–0.1)
Basophils Relative: 0 %
Eosinophils Absolute: 0.1 10*3/uL (ref 0.0–0.5)
Eosinophils Relative: 1 %
HCT: 41.3 % (ref 36.0–46.0)
Hemoglobin: 14.2 g/dL (ref 12.0–15.0)
Immature Granulocytes: 1 %
Lymphocytes Relative: 24 %
Lymphs Abs: 2.1 10*3/uL (ref 0.7–4.0)
MCH: 30 pg (ref 26.0–34.0)
MCHC: 34.4 g/dL (ref 30.0–36.0)
MCV: 87.3 fL (ref 80.0–100.0)
Monocytes Absolute: 0.3 10*3/uL (ref 0.1–1.0)
Monocytes Relative: 4 %
Neutro Abs: 6 10*3/uL (ref 1.7–7.7)
Neutrophils Relative %: 70 %
Platelets: 234 10*3/uL (ref 150–400)
RBC: 4.73 MIL/uL (ref 3.87–5.11)
RDW: 11.9 % (ref 11.5–15.5)
WBC: 8.5 10*3/uL (ref 4.0–10.5)
nRBC: 0 % (ref 0.0–0.2)

## 2022-08-25 LAB — COMPREHENSIVE METABOLIC PANEL
ALT: 33 U/L (ref 0–44)
AST: 31 U/L (ref 15–41)
Albumin: 4.6 g/dL (ref 3.5–5.0)
Alkaline Phosphatase: 104 U/L (ref 38–126)
Anion gap: 12 (ref 5–15)
BUN: 20 mg/dL (ref 6–20)
CO2: 23 mmol/L (ref 22–32)
Calcium: 9.5 mg/dL (ref 8.9–10.3)
Chloride: 104 mmol/L (ref 98–111)
Creatinine, Ser: 0.7 mg/dL (ref 0.44–1.00)
GFR, Estimated: >60 mL/min (ref >60)
Glucose, Bld: 123 mg/dL — ABNORMAL HIGH (ref 70–99)
Potassium: 3.7 mmol/L (ref 3.5–5.1)
Sodium: 139 mmol/L (ref 135–145)
Total Bilirubin: 0.4 mg/dL (ref 0.3–1.2)
Total Protein: 7.8 g/dL (ref 6.5–8.1)

## 2022-08-25 LAB — URINALYSIS, ROUTINE W REFLEX MICROSCOPIC
Bilirubin Urine: NEGATIVE
Glucose, UA: NEGATIVE mg/dL
Ketones, ur: NEGATIVE mg/dL
Leukocytes,Ua: NEGATIVE
Nitrite: NEGATIVE
Protein, ur: NEGATIVE mg/dL
Specific Gravity, Urine: 1.02 (ref 1.005–1.030)
pH: 8.5 — ABNORMAL HIGH (ref 5.0–8.0)

## 2022-08-25 LAB — URINALYSIS, MICROSCOPIC (REFLEX)

## 2022-08-25 LAB — RESP PANEL BY RT-PCR (RSV, FLU A&B, COVID)  RVPGX2
Influenza A by PCR: NEGATIVE
Influenza B by PCR: NEGATIVE
Resp Syncytial Virus by PCR: NEGATIVE
SARS Coronavirus 2 by RT PCR: NEGATIVE

## 2022-08-25 LAB — TROPONIN I (HIGH SENSITIVITY)
Troponin I (High Sensitivity): <2 ng/L (ref <18)
Troponin I (High Sensitivity): <2 ng/L (ref <18)

## 2022-08-25 LAB — PREGNANCY, URINE: Preg Test, Ur: NEGATIVE

## 2022-08-25 MED ORDER — IOHEXOL 300 MG/ML  SOLN
80.0000 mL | Freq: Once | INTRAMUSCULAR | Status: AC | PRN
  Administered 2022-08-25: 80 mL via INTRAVENOUS

## 2022-08-25 MED ORDER — ONDANSETRON 8 MG PO TBDP: ORAL_TABLET | ORAL | 0 refills | Status: AC

## 2022-08-25 MED ORDER — SODIUM CHLORIDE 0.9 % IV BOLUS
500.0000 mL | Freq: Once | INTRAVENOUS | Status: AC
  Administered 2022-08-25: 500 mL via INTRAVENOUS

## 2022-08-25 MED ORDER — DROPERIDOL 2.5 MG/ML IJ SOLN
1.2500 mg | Freq: Once | INTRAMUSCULAR | Status: AC
  Administered 2022-08-25: 1.25 mg via INTRAVENOUS
  Filled 2022-08-25: qty 2

## 2022-08-25 NOTE — ED Provider Notes (Addendum)
[facility] Provider Note   CSN: [id number] Arrival date & time: 08/25/22  0119     History  Chief Complaint  Patient presents with   Emesis    Wanda Lewis is a 47 y.o. female.  The history is provided by the patient.  Emesis Severity:  Moderate Timing:  Intermittent Quality:  Stomach contents Progression:  Unchanged Chronicity:  New Recent urination:  Normal Context: not post-tussive   Relieved by:  Nothing Worsened by:  Nothing Ineffective treatments:  None tried Associated symptoms: diarrhea   Associated symptoms: no fever   Risk factors: no travel [place] endemic areas   Patient with intermittent symptoms      Home Medications Prior [facility] Admission medications   Medication Sig Start Date End Date Taking? Authorizing Provider  ondansetron (ZOFRAN-ODT) 8 MG disintegrating tablet 8mg  ODT q8 hours prn nausea 08/25/22  Yes Chrisy Hillebrand, MD  gabapentin (NEURONTIN) 100 MG capsule Take 1 capsule (100 mg total) by mouth 3 (three) times daily. Patient not taking: Reported on 12/16/2019 09/04/19   Myra Rude, MD  lidocaine (LIDODERM) 5 % Place 1 patch onto the skin daily. Remove & Discard patch within 12 hours or as directed by MD Patient not taking: Reported on 12/16/2019 08/23/19   Caccavale, Sophia, PA-C  methocarbamol (ROBAXIN) 500 MG tablet Take 1 tablet (500 mg total) by mouth at bedtime as needed for muscle spasms. Patient not taking: Reported on 12/16/2019 08/23/19   Caccavale, Sophia, PA-C      Allergies    Patient has no known allergies.    Review of Systems   Review of Systems  Constitutional:  Negative for fever.  HENT:  Negative for facial swelling.   Eyes:  Negative for redness.  Respiratory:  Negative for wheezing and stridor.   Cardiovascular:  Negative for chest pain.  Gastrointestinal:  Positive for diarrhea, nausea and vomiting.  All other systems reviewed and are negative.   Physical Exam Updated Vital  Signs BP (!) 114/53 (BP Location: Left Arm)   Pulse 64   Temp 97.7 F (36.5 C) (Oral)   Resp 16   Ht 4\' 8"  (1.422 m)   Wt 43.1 kg   LMP  (LMP Unknown)   SpO2 100%   BMI 21.30 kg/m  Physical Exam Vitals and nursing note reviewed.  Constitutional:      General: She is not in acute distress.    Appearance: Normal appearance. She is well-developed.  HENT:     Head: Normocephalic and atraumatic.     Nose: Nose normal.  Eyes:     Pupils: Pupils are equal, round, and reactive Myint light.  Cardiovascular:     Rate and Rhythm: Normal rate and regular rhythm.     Pulses: Normal pulses.     Heart sounds: Normal heart sounds.  Pulmonary:     Effort: Pulmonary effort is normal. No respiratory distress.     Breath sounds: Normal breath sounds.  Abdominal:     General: Bowel sounds are normal. There is no distension.     Palpations: Abdomen is soft.     Tenderness: There is no abdominal tenderness. There is no guarding or rebound.  Genitourinary:    Vagina: No vaginal discharge.  Musculoskeletal:        General: Normal range of motion.     Cervical back: Normal range of motion and neck supple.  Skin:    General: Skin is warm and dry.  Capillary Refill: Capillary refill takes less than 2 seconds.     Findings: No erythema or rash.  Neurological:     General: No focal deficit present.     Mental Status: She is alert and oriented [place] person, place, and time.     Deep Tendon Reflexes: Reflexes normal.  Psychiatric:        Mood and Affect: Mood normal.     ED Results / Procedures / Treatments   Labs (all labs ordered are listed, but only abnormal results are displayed) Results for orders placed or performed during the hospital encounter of 08/25/22  CBC with Differential  Result Value Ref Range   WBC 8.5 4.0 - 10.5 K/uL   RBC 4.73 3.87 - 5.11 MIL/uL   Hemoglobin 14.2 12.0 - 15.0 g/dL   HCT 16.1 09.6 - 04.5 %   MCV 87.3 80.0 - 100.0 fL   MCH 30.0 26.0 - 34.0 pg   MCHC 34.4  30.0 - 36.0 g/dL   RDW 40.9 81.1 - 91.4 %   Platelets 234 150 - 400 K/uL   nRBC 0.0 0.0 - 0.2 %   Neutrophils Relative % 70 %   Neutro Abs 6.0 1.7 - 7.7 K/uL   Lymphocytes Relative 24 %   Lymphs Abs 2.1 0.7 - 4.0 K/uL   Monocytes Relative 4 %   Monocytes Absolute 0.3 0.1 - 1.0 K/uL   Eosinophils Relative 1 %   Eosinophils Absolute 0.1 0.0 - 0.5 K/uL   Basophils Relative 0 %   Basophils Absolute 0.0 0.0 - 0.1 K/uL   Immature Granulocytes 1 %   Abs Immature Granulocytes 0.05 0.00 - 0.07 K/uL  Comprehensive metabolic panel  Result Value Ref Range   Sodium 139 135 - 145 mmol/L   Potassium 3.7 3.5 - 5.1 mmol/L   Chloride 104 98 - 111 mmol/L   CO2 23 22 - 32 mmol/L   Glucose, Bld 123 (H) 70 - 99 mg/dL   BUN 20 6 - 20 mg/dL   Creatinine, Ser 7.82 0.44 - 1.00 mg/dL   Calcium 9.5 8.9 - 95.6 mg/dL   Total Protein 7.8 6.5 - 8.1 g/dL   Albumin 4.6 3.5 - 5.0 g/dL   AST 31 15 - 41 U/L   ALT 33 0 - 44 U/L   Alkaline Phosphatase 104 38 - 126 U/L   Total Bilirubin 0.4 0.3 - 1.2 mg/dL   GFR, Estimated >21 >30 mL/min   Anion gap 12 5 - 15  Pregnancy, urine  Result Value Ref Range   Preg Test, Ur NEGATIVE NEGATIVE  Urinalysis, Routine w reflex microscopic -Urine, Clean Catch  Result Value Ref Range   Color, Urine YELLOW YELLOW   APPearance CLEAR CLEAR   Specific Gravity, Urine 1.020 1.005 - 1.030   pH 8.5 (H) 5.0 - 8.0   Glucose, UA NEGATIVE NEGATIVE mg/dL   Hgb urine dipstick TRACE (A) NEGATIVE   Bilirubin Urine NEGATIVE NEGATIVE   Ketones, ur NEGATIVE NEGATIVE mg/dL   Protein, ur NEGATIVE NEGATIVE mg/dL   Nitrite NEGATIVE NEGATIVE   Leukocytes,Ua NEGATIVE NEGATIVE  Urinalysis, Microscopic (reflex)  Result Value Ref Range   RBC / HPF 0-5 0 - 5 RBC/hpf   WBC, UA 0-5 0 - 5 WBC/hpf   Bacteria, UA RARE (A) NONE SEEN   Squamous Epithelial / HPF 0-5 0 - 5 /HPF  Troponin I (High Sensitivity)  Result Value Ref Range   Troponin I (High Sensitivity) <2 <18 ng/L  Troponin I (High  Sensitivity)  Result Value Ref Range   Troponin I (High Sensitivity) <2 <18 ng/L   DG Chest Portable 1 View  Result Date: 08/25/2022 CLINICAL DATA:  Nausea and vomiting with fevers, initial encounter EXAM: PORTABLE CHEST 1 VIEW COMPARISON:  None Available. FINDINGS: The heart size and mediastinal contours are within normal limits. Both lungs are clear. The visualized skeletal structures are unremarkable. IMPRESSION: No active disease. Electronically Signed   By: Alcide Clever M.D.   On: 08/25/2022 02:54    EKG EKG Interpretation Date/Time:  Saturday August 25 2022 02:25:59 EDT Ventricular Rate:  66 PR Interval:  131 QRS Duration:  100 QT Interval:  433 QTC Calculation: 454 R Axis:   72  Text Interpretation: Sinus rhythm Confirmed by Nicanor Alcon, Araly Kaas ([id number]) on 08/25/2022 4:24:20 AM  Radiology DG Chest Portable 1 View  Result Date: 08/25/2022 CLINICAL DATA:  Nausea and vomiting with fevers, initial encounter EXAM: PORTABLE CHEST 1 VIEW COMPARISON:  None Available. FINDINGS: The heart size and mediastinal contours are within normal limits. Both lungs are clear. The visualized skeletal structures are unremarkable. IMPRESSION: No active disease. Electronically Signed   By: Alcide Clever M.D.   On: 08/25/2022 02:54    Procedures Procedures    Medications Ordered in ED Medications  droperidol (INAPSINE) 2.5 MG/ML injection 1.25 mg (1.25 mg Intravenous Given 08/25/22 0254)  sodium chloride 0.9 % bolus 500 mL (0 mLs Intravenous Stopped 08/25/22 0353)    ED Course/ Medical Decision Making/ A&P                             Medical Decision Making Patient with nausea vomiting and diarrhea   Amount and/or Complexity of Data Reviewed Independent Historian: friend    Details: See above  External Data Reviewed: notes.    Details: Previous notes reviewed  Labs: ordered.    Details: Normal white count 8.5, normal hemoglobin 14.3, normal platelets.  Normal sodium 139, normal potassium 3.7, normal  creatinine normal LFTs. Urine negative for UTI Radiology: ordered and independent interpretation performed.    Details: CXR negative by me CT negative by me   Risk Prescription drug management. Risk Details: No further emesis in the ED.  As this is a recurrent issue CT. Have informed patient she will need outpatient MRI of the liver Fristoe better assess lesions seen on CT.  I have explained this at length in person and patient and family verbalize understanding. This was also printed on discharge.  This was also printed on discharge papers.    Zofran ordered as an outpatient medication.      Final Clinical Impression(s) / ED Diagnoses Final diagnoses:  Nausea vomiting and diarrhea   Return for intractable cough, coughing up blood, fevers > 100.4 unrelieved by medication, shortness of breath, intractable vomiting, chest pain, shortness of breath, weakness, numbness, changes in speech, facial asymmetry, abdominal pain, passing out, Inability Londo tolerate liquids or food, cough, altered mental status or any concerns. No signs of systemic illness or infection. The patient is nontoxic-appearing on exam and vital signs are within normal limits.  I have reviewed the triage vital signs and the nursing notes. Pertinent labs & imaging results that were available during my care of the patient were reviewed by me and considered in my medical decision making (see chart for details). After history, exam, and medical workup I feel the patient has been appropriately medically screened and is safe for discharge home. Pertinent diagnoses  were discussed with the patient. Patient was given return precautions.      Genessa Beman, MD 08/25/22 2302

## 2022-08-25 NOTE — ED Notes (Signed)
Pt refused interpretor services, stating her family member with her would translate if needed.

## 2022-08-25 NOTE — ED Triage Notes (Signed)
Pt has been having N/V/D x 2 days with fever & chills.  Denies any CP, SOB. Pt has been feeling tired, c/o no energy.
